# Patient Record
Sex: Female | Born: 1990 | Race: White | Hispanic: No | Marital: Single | Smoking: Never smoker
Health system: Southern US, Community
[De-identification: ages and names within clinical notes are randomized; demographics above are authoritative.]

## PROBLEM LIST (undated history)

## (undated) DIAGNOSIS — E162 Hypoglycemia, unspecified: Secondary | ICD-10-CM

## (undated) DIAGNOSIS — I639 Cerebral infarction, unspecified: Secondary | ICD-10-CM

---

## 2014-07-26 ENCOUNTER — Emergency Department: Payer: Medicare Other

## 2014-07-26 ENCOUNTER — Emergency Department
Admission: EM | Admit: 2014-07-26 | Discharge: 2014-07-26 | Disposition: A | Discharge status: Home / Self Care | Payer: Medicare Other | Attending: Emergency Medicine | Admitting: Emergency Medicine

## 2014-07-26 ENCOUNTER — Encounter: Payer: Self-pay | Admitting: Emergency Medicine

## 2014-07-26 ENCOUNTER — Other Ambulatory Visit: Payer: Self-pay

## 2014-07-26 DIAGNOSIS — W01190A Fall on same level from slipping, tripping and stumbling with subsequent striking against furniture, initial encounter: Secondary | ICD-10-CM | POA: Insufficient documentation

## 2014-07-26 DIAGNOSIS — S0990XA Unspecified injury of head, initial encounter: Secondary | ICD-10-CM | POA: Diagnosis not present

## 2014-07-26 DIAGNOSIS — R110 Nausea: Secondary | ICD-10-CM | POA: Diagnosis not present

## 2014-07-26 DIAGNOSIS — Z79899 Other long term (current) drug therapy: Secondary | ICD-10-CM | POA: Insufficient documentation

## 2014-07-26 DIAGNOSIS — Y998 Other external cause status: Secondary | ICD-10-CM | POA: Insufficient documentation

## 2014-07-26 DIAGNOSIS — Z793 Long term (current) use of hormonal contraceptives: Secondary | ICD-10-CM | POA: Insufficient documentation

## 2014-07-26 DIAGNOSIS — Y9289 Other specified places as the place of occurrence of the external cause: Secondary | ICD-10-CM | POA: Diagnosis not present

## 2014-07-26 DIAGNOSIS — Y9389 Activity, other specified: Secondary | ICD-10-CM | POA: Diagnosis not present

## 2014-07-26 DIAGNOSIS — R55 Syncope and collapse: Secondary | ICD-10-CM | POA: Insufficient documentation

## 2014-07-26 HISTORY — DX: Cerebral infarction, unspecified: I63.9

## 2014-07-26 HISTORY — DX: Hypoglycemia, unspecified: E16.2

## 2014-07-26 LAB — URINALYSIS COMPLETE WITH MICROSCOPIC (ARMC ONLY)
BILIRUBIN URINE: NEGATIVE
Bacteria, UA: NONE SEEN
Glucose, UA: NEGATIVE mg/dL
Ketones, ur: NEGATIVE mg/dL
Leukocytes, UA: NEGATIVE
Nitrite: NEGATIVE
PH: 6 (ref 5.0–8.0)
PROTEIN: NEGATIVE mg/dL
Specific Gravity, Urine: 1.018 (ref 1.005–1.030)

## 2014-07-26 LAB — GLUCOSE, CAPILLARY: Glucose-Capillary: 77 mg/dL (ref 65–99)

## 2014-07-26 MED ORDER — ONDANSETRON HCL 4 MG PO TABS: 4.0000 mg | ORAL_TABLET | Freq: Four times a day (QID) | ORAL | Status: AC | PRN

## 2014-07-26 NOTE — ED Notes (Signed)
Pt to XR

## 2014-07-26 NOTE — ED Notes (Signed)
Per MD pt cleared of C-spine and can eat. Pt given tray and assisted to the bathroom.

## 2014-07-26 NOTE — Discharge Instructions (Signed)
Nausea, Adult Nausea is the feeling that you have an upset stomach or have to vomit. Nausea by itself is not likely a serious concern, but it may be an early sign of more serious medical problems. As nausea gets worse, it can lead to vomiting. If vomiting develops, there is the risk of dehydration.  CAUSES   Viral infections.  Food poisoning.  Medicines.  Pregnancy.  Motion sickness.  Migraine headaches.  Emotional distress.  Severe pain from any source.  Alcohol intoxication. HOME CARE INSTRUCTIONS  Get plenty of rest.  Ask your caregiver about specific rehydration instructions.  Eat small amounts of food and sip liquids more often.  Take all medicines as told by your caregiver. SEEK MEDICAL CARE IF:  You have not improved after 2 days, or you get worse.  You have a headache. SEEK IMMEDIATE MEDICAL CARE IF:   You have a fever.  You faint.  You keep vomiting or have blood in your vomit.  You are extremely weak or dehydrated.  You have dark or bloody stools.  You have severe chest or abdominal pain. MAKE SURE YOU:  Understand these instructions.  Will watch your condition.  Will get help right away if you are not doing well or get worse. Document Released: 02/25/2004 Document Revised: 10/12/2011 Document Reviewed: 09/29/2010 [facility] Patient Information 2015 [place], [place]. This information is not intended to replace advice given to you by your health care provider. Make sure you discuss any questions you have with your health care provider.  Syncope Syncope is a medical term for fainting or passing out. This means you lose consciousness and drop to the ground. People are generally unconscious for less than 5 minutes. You may have some muscle twitches for up to 15 seconds before waking up and returning to normal. Syncope occurs more often in older adults, but it can happen to anyone. While most causes of syncope are not dangerous, syncope can be a sign of a  serious medical problem. It is important to seek medical care.  CAUSES  Syncope is caused by a sudden drop in blood flow to the brain. The specific cause is often not determined. Factors that can bring on syncope include:  Taking medicines that lower blood pressure.  Sudden changes in posture, such as standing up quickly.  Taking more medicine than prescribed.  Standing in one place for too long.  Seizure disorders.  Dehydration and excessive exposure to heat.  Low blood sugar (hypoglycemia).  Straining to have a bowel movement.  Heart disease, irregular heartbeat, or other circulatory problems.  Fear, emotional distress, seeing blood, or severe pain. SYMPTOMS  Right before fainting, you may:  Feel dizzy or light-headed.  Feel nauseous.  See all white or all black in your field of vision.  Have cold, clammy skin. DIAGNOSIS  Your health care provider will ask about your symptoms, perform a physical exam, and perform an electrocardiogram (ECG) to record the electrical activity of your heart. Your health care provider may also perform other heart or blood tests to determine the cause of your syncope which may include:  Transthoracic echocardiogram (TTE). During echocardiography, sound waves are used to evaluate how blood flows through your heart.  Transesophageal echocardiogram (TEE).  Cardiac monitoring. This allows your health care provider to monitor your heart rate and rhythm in real time.  Holter monitor. This is a portable device that records your heartbeat and can help diagnose heart arrhythmias. It allows your health care provider to track your heart  activity for several days, if needed.  Stress tests by exercise or by giving medicine that makes the heart beat faster. TREATMENT  In most cases, no treatment is needed. Depending on the cause of your syncope, your health care provider may recommend changing or stopping some of your medicines. HOME CARE  INSTRUCTIONS  Have someone stay with you until you feel stable.  Do not drive, use machinery, or play sports until your health care provider says it is okay.  Keep all follow-up appointments as directed by your health care provider.  Lie down right away if you start feeling like you might faint. Breathe deeply and steadily. Wait until all the symptoms have passed.  Drink enough fluids to keep your urine clear or pale yellow.  If you are taking blood pressure or heart medicine, get up slowly and take several minutes to sit and then stand. This can reduce dizziness. SEEK IMMEDIATE MEDICAL CARE IF:   You have a severe headache.  You have unusual pain in the chest, abdomen, or back.  You are bleeding from your mouth or rectum, or you have black or tarry stool.  You have an irregular or very fast heartbeat.  You have pain with breathing.  You have repeated fainting or seizure-like jerking during an episode.  You faint when sitting or lying down.  You have confusion.  You have trouble walking.  You have severe weakness.  You have vision problems. If you fainted, call your local emergency services (911 in [place] Do not drive yourself to the hospital.  MAKE SURE YOU:  Understand these instructions.  Will watch your condition.  Will get help right away if you are not doing well or get worse. Document Released: 01/17/2005 Document Revised: 01/22/2013 Document Reviewed: 03/18/2011 [facility] Patient Information 2015 [place], [place]. This information is not intended to replace advice given to you by your health care provider. Make sure you discuss any questions you have with your health care provider.

## 2014-07-26 NOTE — ED Notes (Signed)
Pt assisted to bedpan with mother for assistance. NAD noted at this time.

## 2014-07-26 NOTE — ED Provider Notes (Signed)
[facility] Emergency Department Provider Note  ____________________________________________  Time seen: 10:40 AM  I have reviewed the triage vital signs and the nursing notes.   HISTORY  Chief Complaint Loss of Consciousness    HPI Deborah Payne is a 24 y.o. female reports that she's had nausea for the past 2-3 days. As a result, she did not eat breakfast this morning because she wasn't feeling well. She had not eaten or drank anything, and then around 8:30 to 9:00 this morning while patient was at work, she became lightheaded and passed out. She reportedly hit her head on a table. She complains of head and neck pain as well as pain in the left inferior chest that is sharp and hurts to breathe. These pains were not present prior to the fall.     Past Medical History  Diagnosis Date  . CVA (cerebral vascular accident)   . Hypoglycemia     There are no active problems to display for this patient.   Past Surgical History  Procedure Laterality Date  . Cesarean section      Current Outpatient Rx  Name  Route  Sig  Dispense  Refill  . carbamazepine (TEGRETOL) 100 MG chewable tablet   Oral   Chew 100 mg by mouth 3 (three) times daily.         . Lurasidone HCl (LATUDA) 60 MG TABS   Oral   Take 60 mg by mouth daily.         . norethindrone (ERRIN) 0.35 MG tablet   Oral   Take 1 tablet by mouth daily.         . ondansetron (ZOFRAN) 4 MG tablet   Oral   Take 1 tablet (4 mg total) by mouth every 6 (six) hours as needed for nausea or vomiting.   20 tablet   1     Allergies Dilantin  History reviewed. No pertinent family history.  Social History History  Substance Use Topics  . Smoking status: Never Smoker   . Smokeless tobacco: Never Used  . Alcohol Use: Yes     Comment: Occassional    Review of Systems  Constitutional: No fever or chills. No weight changes Eyes:No blurry vision or double vision.  ENT: No sore  throat. Cardiovascular: As above. Respiratory: No dyspnea or cough. Gastrointestinal: Negative for abdominal pain, vomiting and diarrhea.  No BRBPR or melena. Genitourinary: Negative for dysuria, urinary retention, bloody urine, or difficulty urinating. Musculoskeletal: Negative for back pain. No joint swelling or pain. Skin: Negative for rash. Neurological: As above  Psychiatric:No anxiety or depression.   Endocrine:No hot/cold intolerance, changes in energy, or sleep difficulty.  10-point ROS otherwise negative.  ____________________________________________   PHYSICAL EXAM:  VITAL SIGNS: ED Triage Vitals  Enc Vitals Group     BP 07/26/14 1047 137/78 mmHg     Pulse Rate 07/26/14 1047 73     Resp 07/26/14 1418 18     Temp 07/26/14 1047 97.7 F (36.5 C)     Temp Source 07/26/14 1047 Oral     SpO2 07/26/14 1047 100 %     Weight 07/26/14 1047 263 lb (119.296 kg)     Height 07/26/14 1047 5' (1.524 m)     Head Cir --      Peak Flow --      Pain Score 07/26/14 1053 5     Pain Loc --      Pain Edu? --      Excl.  in GC? --      Constitutional: Alert and oriented. Well appearing and in no distress. Slightly anxious. Arrives in c-collar and backboard prior to arrival by EMS Eyes: No scleral icterus. No conjunctival pallor. PERRL. EOMI ENT   Head: Normocephalic and atraumatic.   Nose: No congestion/rhinnorhea. No septal hematoma   Mouth/Throat: MMM, no pharyngeal erythema. No peritonsillar mass. No uvula shift.   Neck: No stridor. No SubQ emphysema. No meningismus. Positive midline C-spine tenderness Hematological/Lymphatic/Immunilogical: No cervical lymphadenopathy. Cardiovascular: RRR. Normal and symmetric distal pulses are present in all extremities. No murmurs, rubs, or gallops. Respiratory: Normal respiratory effort without tachypnea nor retractions. Breath sounds are clear and equal bilaterally. No wheezes/rales/rhonchi. Left inferior chest wall markedly  tender to palpation Gastrointestinal: Soft and nontender. No distention. There is no CVA tenderness.  No rebound, rigidity, or guarding. Genitourinary: deferred Musculoskeletal: Nontender with normal range of motion in all extremities. No joint effusions.  No lower extremity tenderness.  No edema. Neurologic:   Normal speech and language.  CN 2-10 normal. Motor grossly intact. No pronator drift.  Normal gait. No gross focal neurologic deficits are appreciated.  Skin:  Skin is warm, dry and intact. No rash noted.  No petechiae, purpura, or bullae. Psychiatric: Mood and affect are normal. Speech and behavior are normal. Patient exhibits appropriate insight and judgment.  ____________________________________________    LABS (pertinent positives/negatives) (all labs ordered are listed, but only abnormal results are displayed) Labs Reviewed  URINALYSIS COMPLETEWITH MICROSCOPIC ([facility] ONLY) - Abnormal; Notable for the following:    Color, Urine YELLOW (*)    APPearance CLEAR (*)    Hgb urine dipstick 1+ (*)    Squamous Epithelial / LPF 0-5 (*)    All other components within normal limits  POC URINE PREG, ED   ____________________________________________   EKG  Interpreted by me  Date: 07/26/2014  Rate: 74  Rhythm: normal sinus rhythm  QRS Axis: normal  Intervals: normal  ST/T Wave abnormalities: normal  Conduction Disutrbances: none  Narrative Interpretation: unremarkable      ____________________________________________    RADIOLOGY  CT head unremarkable CT cervical spine unremarkable Chest x-ray unremarkable  ____________________________________________   PROCEDURES  ____________________________________________   INITIAL IMPRESSION / ASSESSMENT AND PLAN / ED COURSE  Pertinent labs & imaging results that were available during my care of the patient were reviewed by me and considered in my medical decision making (see chart for details).  Patient presents  with syncope secondary to likely dehydration or hypoglycemia transiently as result of not eating from nausea. Increase take on arrival to the ED is 77. We'll check CT head and neck especially with her history of a prior CVA. No focal neuro deficits at this time. Stroke scale is 0.  ----------------------------------------- 3:35 PM on 07/26/2014 -----------------------------------------  Workup unremarkable. Urinalysis negative, pregnancy negative. We'll discharge home with Zofran and have her follow up with primary care.  ____________________________________________   FINAL CLINICAL IMPRESSION(S) / ED DIAGNOSES  Final diagnoses:  Syncope, unspecified syncope type  Nausea      Sharman Cheek, MD 07/26/14 1535

## 2014-07-26 NOTE — ED Notes (Signed)
Ems form work for syncopal episode. Feeling lightheaded at 0830 and then passed out at break time at 0920. Hit head on table and hit floor. Did not eat breakfast.

## 2014-07-26 NOTE — ED Notes (Signed)
Ambulated out of department in NAD with mother.

## 2015-11-27 IMAGING — CR DG CHEST 2V
2 series · 2 of 2 positions shown · non-contrast
Comparison: None.

CLINICAL DATA: syncopal episode and, acute left chest pain

EXAM:
CHEST  2 VIEW

[chest pa]
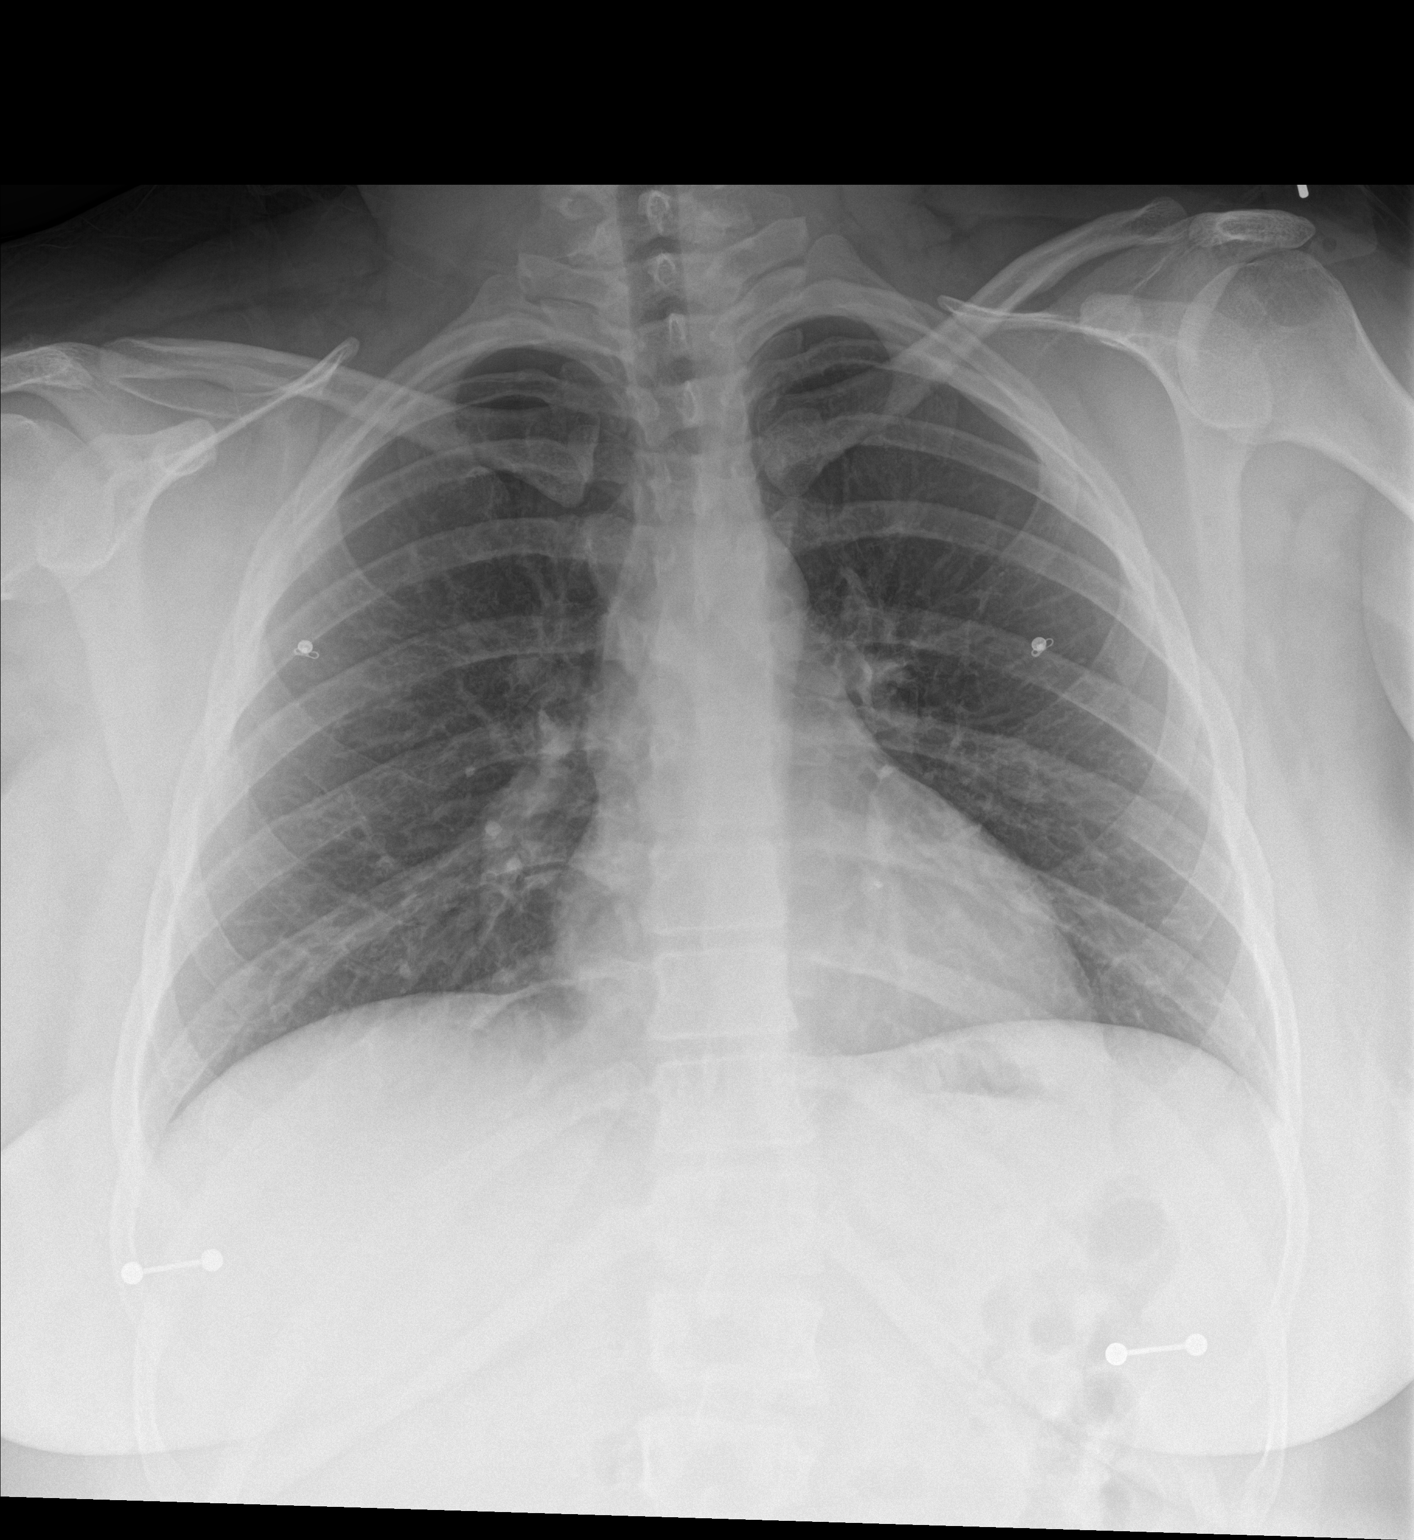

[chest lat]
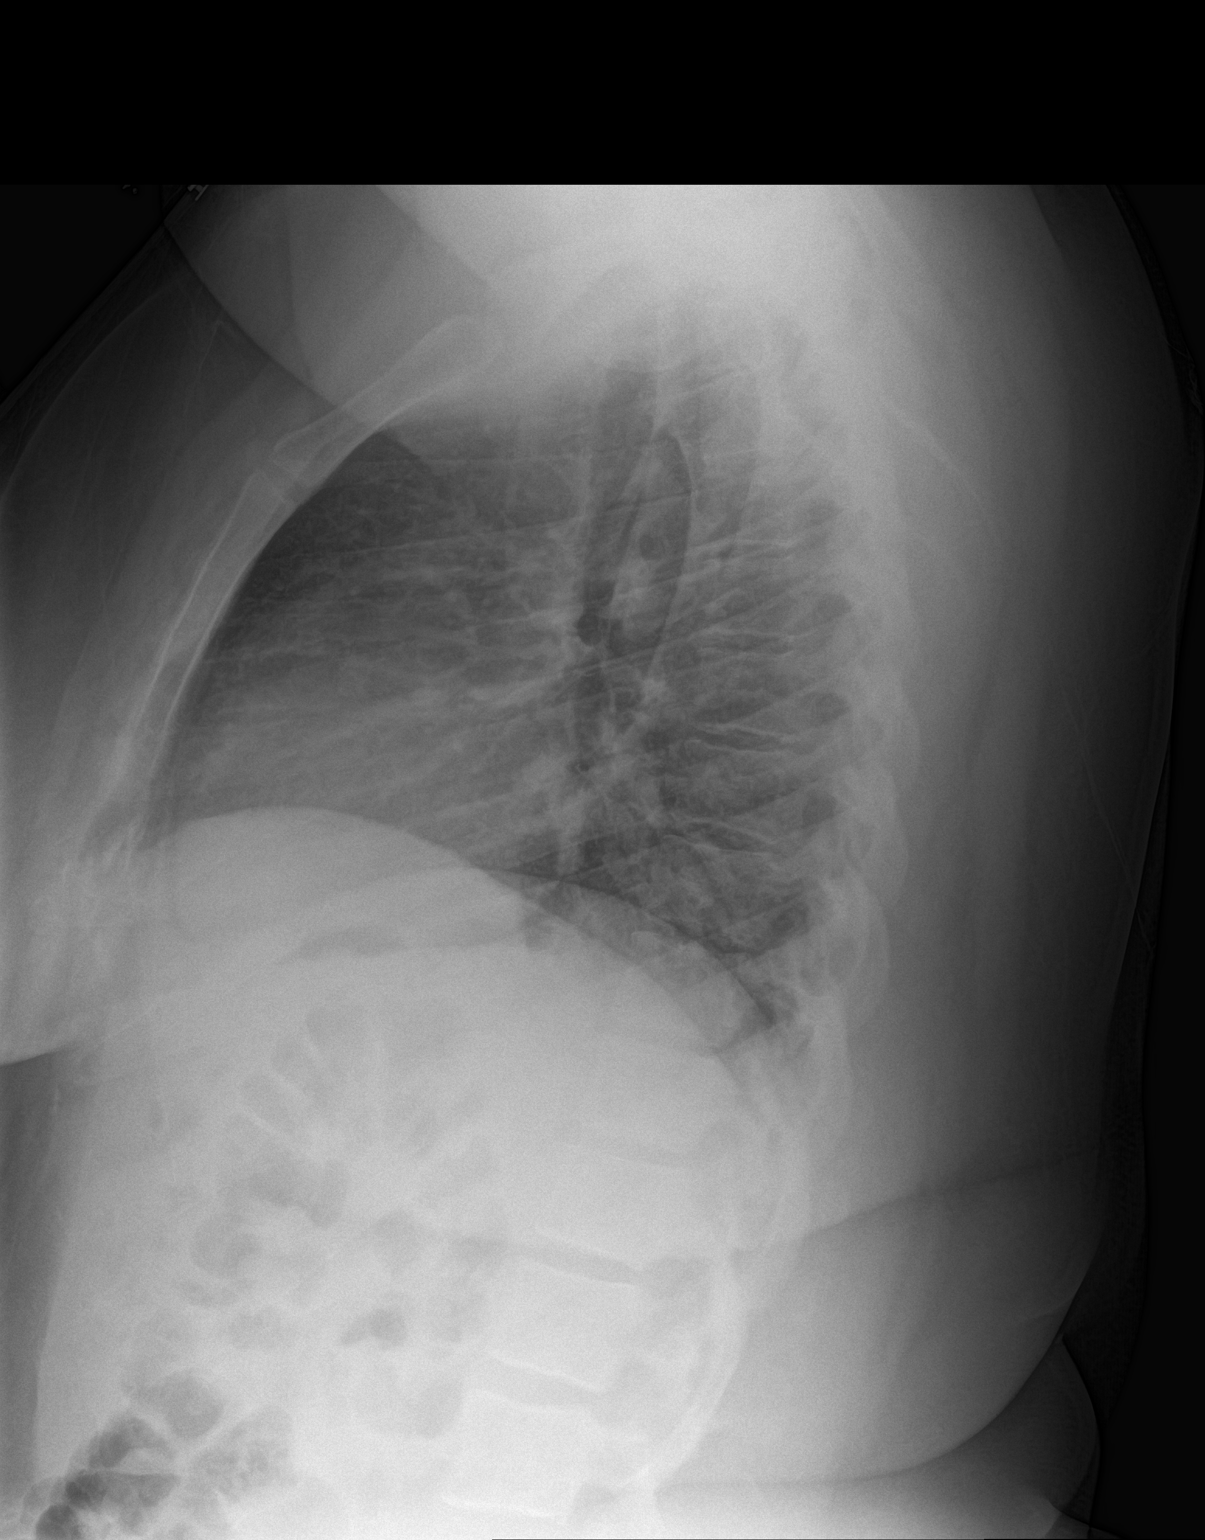

[2 of 2 positions shown; findings below may reference images not displayed]

FINDINGS: The heart size and mediastinal contours are within normal limits.
Both lungs are clear. The visualized skeletal structures are
unremarkable.
IMPRESSION: No active cardiopulmonary disease.

## 2015-11-27 IMAGING — CT CT HEAD W/O CM
3 series · 16 of 30 positions shown, 19 images · non-contrast
Comparison: None.

CLINICAL DATA: Headache. Patient passed out and hit head on floor.
History of stroke at age 6.

EXAM:
CT HEAD WITHOUT CONTRAST
CT CERVICAL SPINE WITHOUT CONTRAST
TECHNIQUE: Multidetector CT imaging of the head and cervical spine was
performed following the standard protocol without intravenous
contrast. Multiplanar CT image reconstructions of the cervical spine
were also generated.

[Series 2: head wo · axial · 0.44mm/px · z∈[+1207,+1279]mm · 3 of 34 slices shown]
[im 9/34  brain]
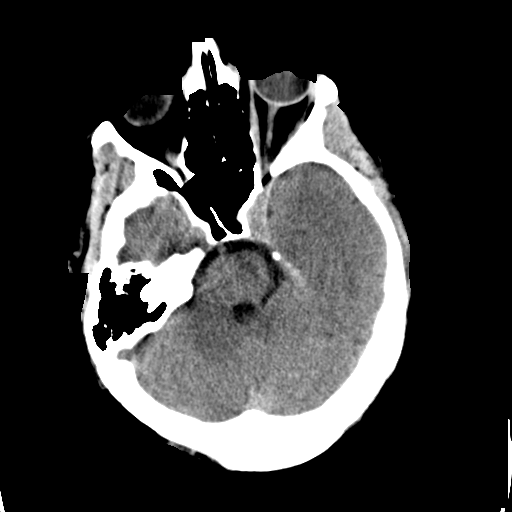
[im 17/34  brain]
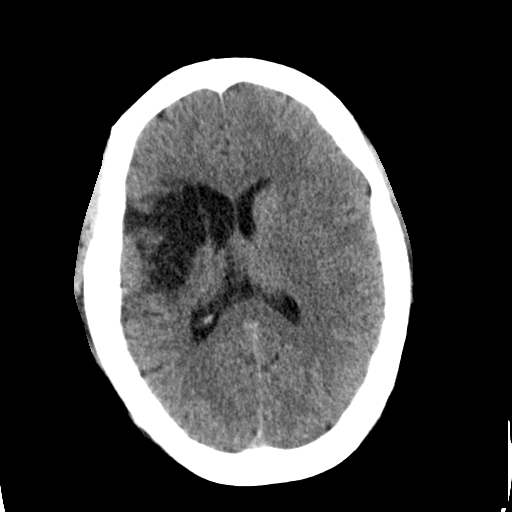
[im 25/34  brain]
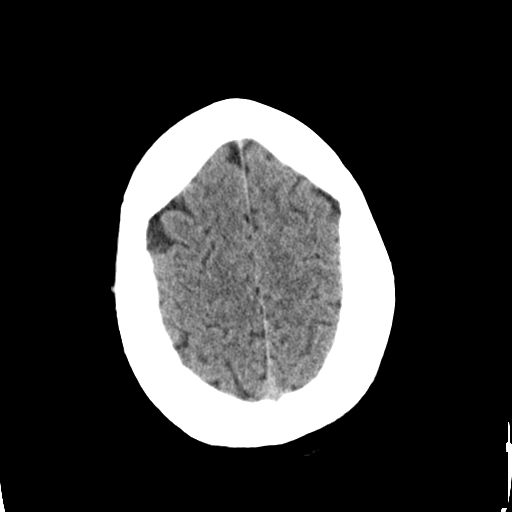

[Series 6: c spine soft · axial · 0.36mm/px · z∈[+1038,+1054]mm · 2 of 85 slices shown]
[im 8/85  brain]
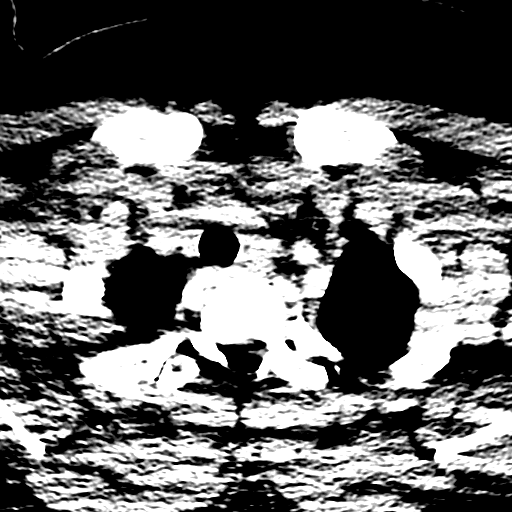
[im 16/85  brain]
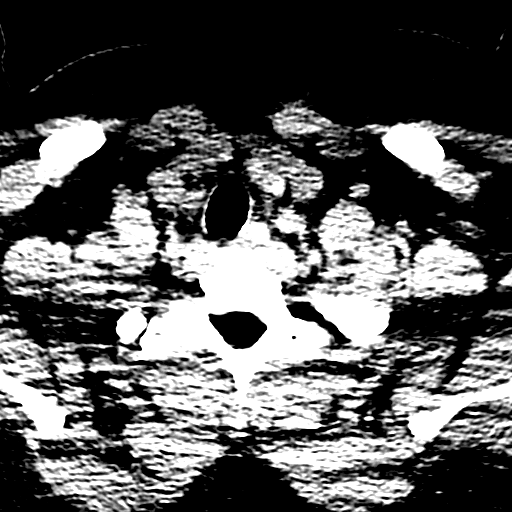

[Series 9: orthogonal axials · axial · 0.26mm/px · z∈[+1016,+1142]mm · 11 of 87 slices shown, 14 images]
[im 8/87  brain]
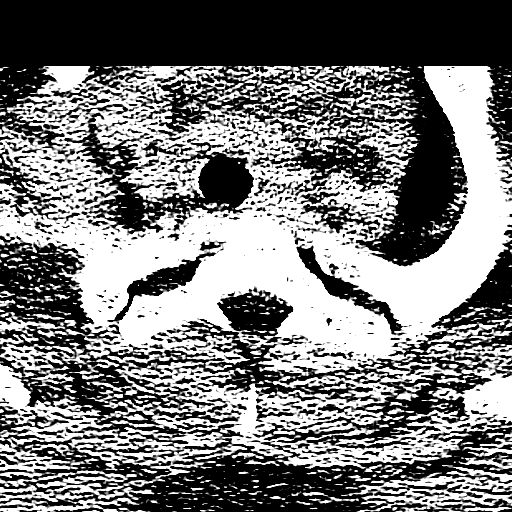
[im 8/87  bone]
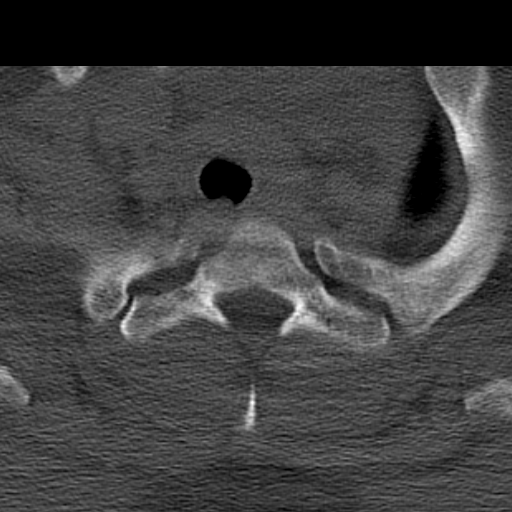
[im 15/87  brain]
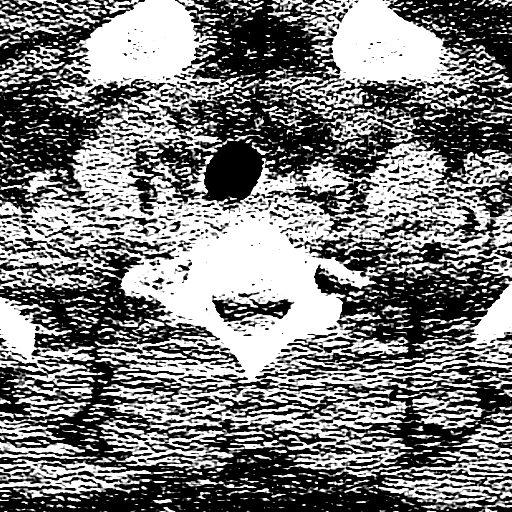
[im 22/87  brain]
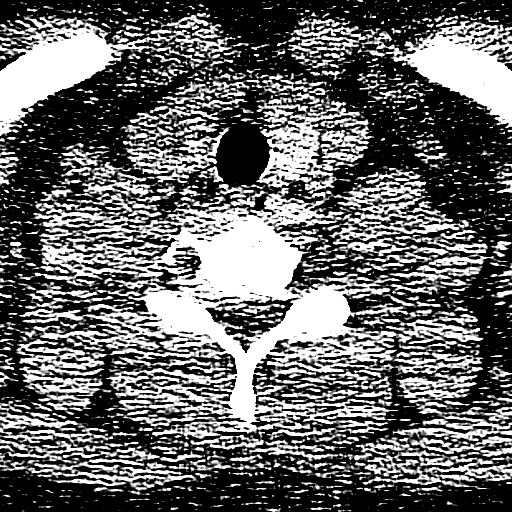
[im 29/87  brain]
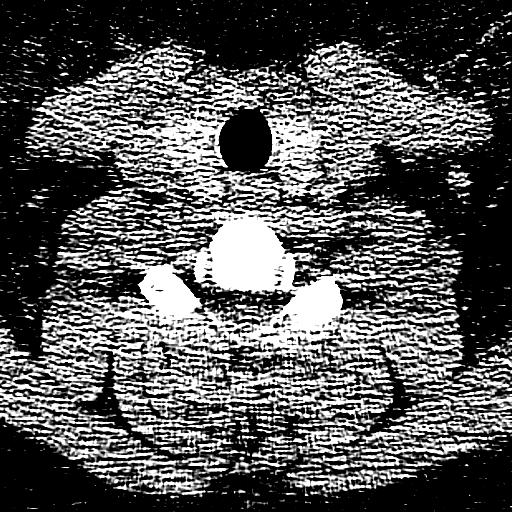
[im 36/87  brain]
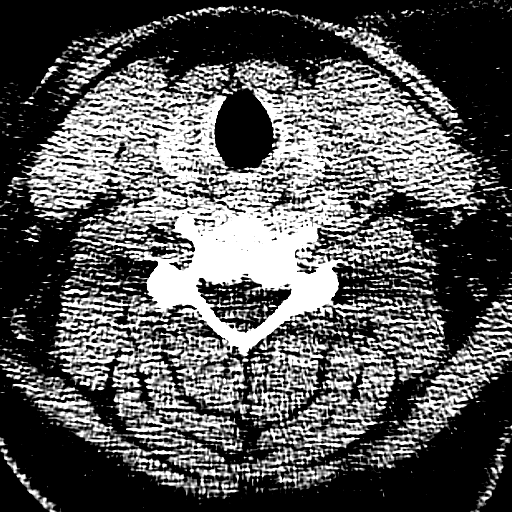
[im 36/87  bone]
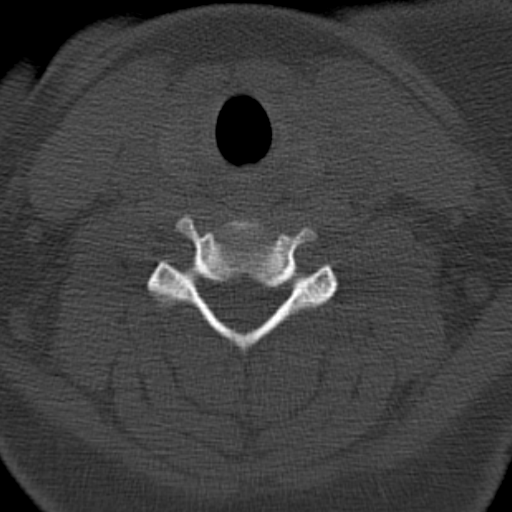
[im 44/87  brain]
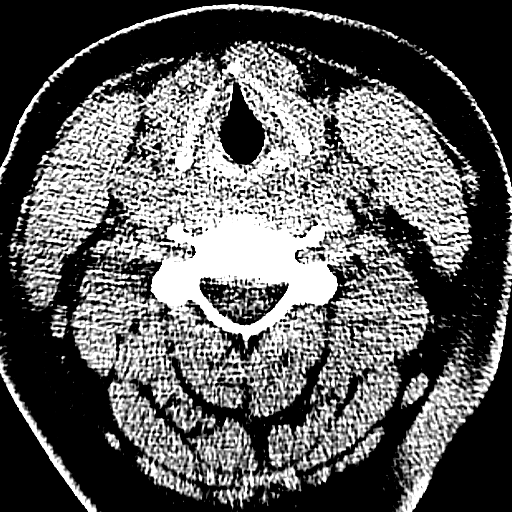
[im 51/87  brain]
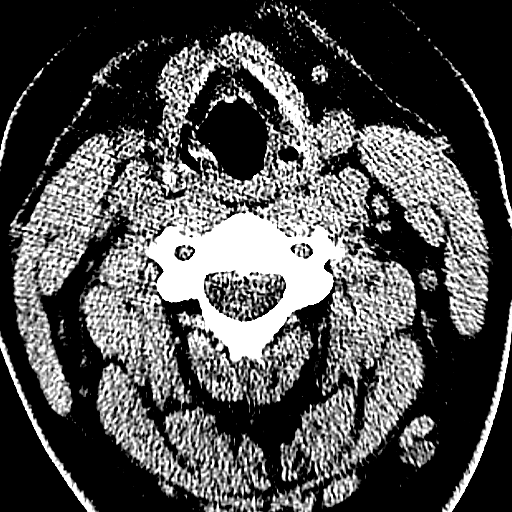
[im 58/87  brain]
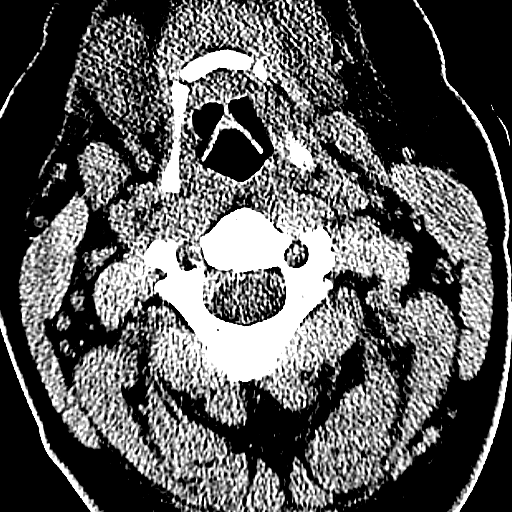
[im 65/87  brain]
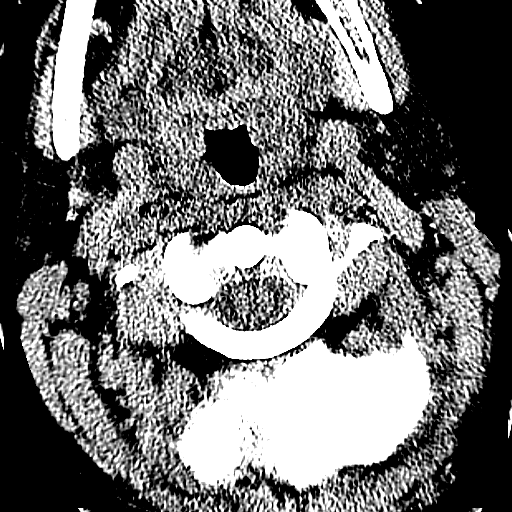
[im 65/87  bone]
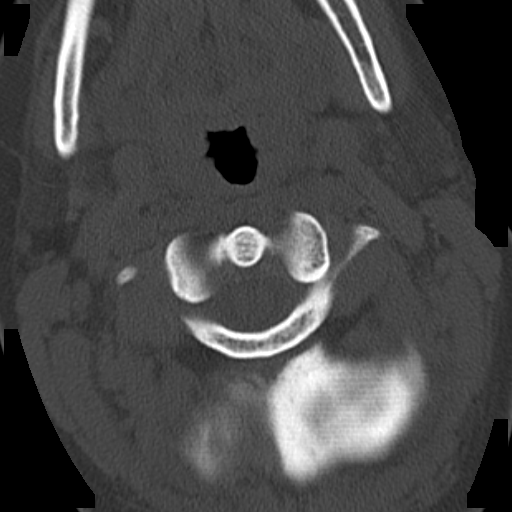
[im 72/87  brain]
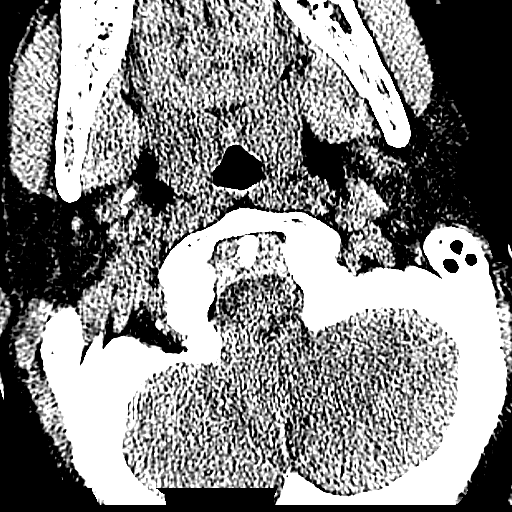
[im 79/87  brain]
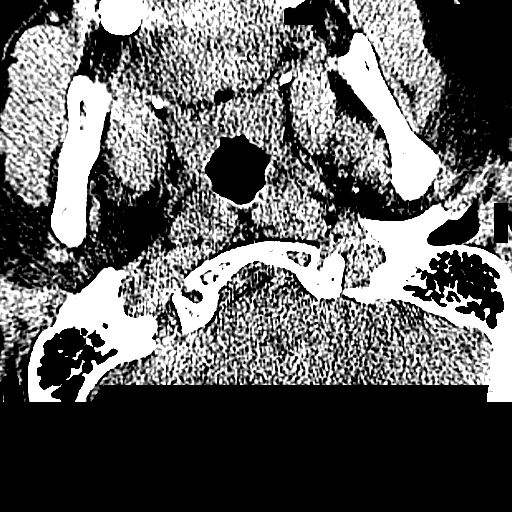

[16 of 30 positions shown; findings below may reference images not displayed]

FINDINGS: CT HEAD FINDINGS

There is encephalomalacia and hydrocephalus ex vacuo consistent with
previous right MCA distribution infarct. There is no intra or
extra-axial fluid collection or mass lesion. The basilar cisterns
and ventricles otherwise have a normal appearance. There is no CT
evidence for acute infarction or hemorrhage. Bone windows show no
calvarial fracture. No scalp edema or hematoma.

CT CERVICAL SPINE FINDINGS

There is loss of cervical lordosis. This may be secondary to
splinting, soft tissue injury, or positioning. Otherwise, there is
normal alignment of the cervical spine. There is no evidence for
acute fracture or dislocation. Prevertebral soft tissues have a
normal appearance. Lung apices have a normal appearance. Small
anterior cervical chain lymph nodes are noted, nonspecific in
appearance.
IMPRESSION: 1. Old right MCA infarct.
2.  No evidence for acute intracranial abnormality.
3.  No evidence for acute bony abnormality the cervical spine.
4. Loss of cervical lordosis which may be positional.
